# Patient Record
Sex: Female | Born: 1985 | Race: White | Hispanic: Yes | Marital: Single | Smoking: Current every day smoker
Health system: Southern US, Community
[De-identification: ages and names within clinical notes are randomized; demographics above are authoritative.]

## PROBLEM LIST (undated history)

## (undated) ENCOUNTER — Inpatient Hospital Stay (HOSPITAL_COMMUNITY): Payer: Self-pay

## (undated) DIAGNOSIS — F329 Major depressive disorder, single episode, unspecified: Secondary | ICD-10-CM

## (undated) DIAGNOSIS — R569 Unspecified convulsions: Secondary | ICD-10-CM

## (undated) DIAGNOSIS — N390 Urinary tract infection, site not specified: Secondary | ICD-10-CM

## (undated) DIAGNOSIS — F319 Bipolar disorder, unspecified: Secondary | ICD-10-CM

## (undated) DIAGNOSIS — M069 Rheumatoid arthritis, unspecified: Secondary | ICD-10-CM

## (undated) DIAGNOSIS — F32A Depression, unspecified: Secondary | ICD-10-CM

## (undated) DIAGNOSIS — F419 Anxiety disorder, unspecified: Secondary | ICD-10-CM

## (undated) HISTORY — PX: WISDOM TOOTH EXTRACTION: SHX21

---

## 2006-12-20 ENCOUNTER — Encounter: Payer: Self-pay | Admitting: Maternal & Fetal Medicine

## 2007-01-11 ENCOUNTER — Emergency Department: Payer: Self-pay | Admitting: Emergency Medicine

## 2007-01-26 ENCOUNTER — Emergency Department: Payer: Self-pay | Admitting: Emergency Medicine

## 2007-02-17 ENCOUNTER — Observation Stay: Payer: Self-pay | Admitting: Obstetrics & Gynecology

## 2007-07-04 ENCOUNTER — Observation Stay: Payer: Self-pay

## 2007-07-07 ENCOUNTER — Observation Stay: Payer: Self-pay

## 2007-07-08 ENCOUNTER — Inpatient Hospital Stay: Payer: Self-pay

## 2008-10-25 ENCOUNTER — Emergency Department: Payer: Self-pay | Admitting: Unknown Physician Specialty

## 2008-12-13 ENCOUNTER — Emergency Department: Payer: Self-pay | Admitting: Internal Medicine

## 2008-12-14 ENCOUNTER — Emergency Department: Payer: Self-pay | Admitting: Emergency Medicine

## 2009-03-18 ENCOUNTER — Observation Stay: Payer: Self-pay | Admitting: Obstetrics & Gynecology

## 2009-06-02 ENCOUNTER — Observation Stay: Payer: Self-pay

## 2009-06-17 ENCOUNTER — Inpatient Hospital Stay: Payer: Self-pay

## 2009-07-09 ENCOUNTER — Emergency Department: Payer: Self-pay | Admitting: Emergency Medicine

## 2010-12-02 ENCOUNTER — Emergency Department: Payer: Self-pay | Admitting: Emergency Medicine

## 2011-11-06 ENCOUNTER — Emergency Department: Payer: Self-pay | Admitting: Emergency Medicine

## 2011-11-06 LAB — PREGNANCY, URINE: Pregnancy Test, Urine: NEGATIVE m[IU]/mL

## 2011-12-18 ENCOUNTER — Emergency Department: Payer: Self-pay | Admitting: *Deleted

## 2011-12-18 LAB — URINALYSIS, COMPLETE
Leukocyte Esterase: NEGATIVE
Nitrite: NEGATIVE
Protein: NEGATIVE
RBC,UR: 2 /HPF (ref 0–5)
Specific Gravity: 1.021 (ref 1.003–1.030)
Squamous Epithelial: 6
WBC UR: 4 /HPF (ref 0–5)

## 2011-12-18 LAB — DRUG SCREEN, URINE
Barbiturates, Ur Screen: NEGATIVE (ref ?–200)
Cannabinoid 50 Ng, Ur ~~LOC~~: NEGATIVE (ref ?–50)
MDMA (Ecstasy)Ur Screen: NEGATIVE (ref ?–500)
Methadone, Ur Screen: POSITIVE (ref ?–300)
Opiate, Ur Screen: NEGATIVE (ref ?–300)
Phencyclidine (PCP) Ur S: NEGATIVE (ref ?–25)

## 2011-12-18 LAB — COMPREHENSIVE METABOLIC PANEL
Albumin: 3.5 g/dL (ref 3.4–5.0)
Anion Gap: 7 (ref 7–16)
BUN: 15 mg/dL (ref 7–18)
Chloride: 105 mmol/L (ref 98–107)
Co2: 28 mmol/L (ref 21–32)
Creatinine: 1.14 mg/dL (ref 0.60–1.30)
EGFR (African American): >60
EGFR (Non-African Amer.): >60
Glucose: 138 mg/dL — ABNORMAL HIGH (ref 65–99)
Osmolality: 282 (ref 275–301)
Potassium: 4.1 mmol/L (ref 3.5–5.1)
SGOT(AST): 41 U/L — ABNORMAL HIGH (ref 15–37)
Total Protein: 7.5 g/dL (ref 6.4–8.2)

## 2011-12-18 LAB — ETHANOL: Ethanol: <3 mg/dL

## 2011-12-18 LAB — CBC
MCH: 30 pg (ref 26.0–34.0)
MCHC: 32.6 g/dL (ref 32.0–36.0)
RBC: 4.72 10*6/uL (ref 3.80–5.20)
WBC: 7 10*3/uL (ref 3.6–11.0)

## 2011-12-18 LAB — ACETAMINOPHEN LEVEL: Acetaminophen: <2 ug/mL

## 2011-12-18 LAB — TSH: Thyroid Stimulating Horm: 0.4 u[IU]/mL — ABNORMAL LOW

## 2012-01-16 ENCOUNTER — Emergency Department: Payer: Self-pay | Admitting: Emergency Medicine

## 2012-01-16 LAB — COMPREHENSIVE METABOLIC PANEL
Anion Gap: 7 (ref 7–16)
BUN: 14 mg/dL (ref 7–18)
Bilirubin,Total: 0.8 mg/dL (ref 0.2–1.0)
Calcium, Total: 9.6 mg/dL (ref 8.5–10.1)
Chloride: 105 mmol/L (ref 98–107)
Co2: 27 mmol/L (ref 21–32)
Creatinine: 0.96 mg/dL (ref 0.60–1.30)
EGFR (African American): >60
EGFR (Non-African Amer.): >60
Glucose: 105 mg/dL — ABNORMAL HIGH (ref 65–99)
Osmolality: 278 (ref 275–301)
Potassium: 5.4 mmol/L — ABNORMAL HIGH (ref 3.5–5.1)
Sodium: 139 mmol/L (ref 136–145)
Total Protein: 8.7 g/dL — ABNORMAL HIGH (ref 6.4–8.2)

## 2012-01-16 LAB — CBC
HCT: 45.3 % (ref 35.0–47.0)
HGB: 14.9 g/dL (ref 12.0–16.0)
MCH: 30.1 pg (ref 26.0–34.0)
MCHC: 32.8 g/dL (ref 32.0–36.0)
MCV: 92 fL (ref 80–100)
Platelet: 374 10*3/uL (ref 150–440)

## 2012-01-16 LAB — DRUG SCREEN, URINE
Amphetamines, Ur Screen: NEGATIVE (ref ?–1000)
Benzodiazepine, Ur Scrn: POSITIVE (ref ?–200)
Cannabinoid 50 Ng, Ur ~~LOC~~: NEGATIVE (ref ?–50)
Cocaine Metabolite,Ur ~~LOC~~: POSITIVE (ref ?–300)
MDMA (Ecstasy)Ur Screen: NEGATIVE (ref ?–500)
Methadone, Ur Screen: NEGATIVE (ref ?–300)
Opiate, Ur Screen: NEGATIVE (ref ?–300)
Tricyclic, Ur Screen: NEGATIVE (ref ?–1000)

## 2012-01-16 LAB — URINALYSIS, COMPLETE
Bilirubin,UR: NEGATIVE
Glucose,UR: NEGATIVE mg/dL (ref 0–75)
Ketone: NEGATIVE
Leukocyte Esterase: NEGATIVE
Ph: 5 (ref 4.5–8.0)
Protein: NEGATIVE
RBC,UR: NONE SEEN /HPF (ref 0–5)
Specific Gravity: 1.027 (ref 1.003–1.030)
Squamous Epithelial: 6

## 2012-01-16 LAB — TSH: Thyroid Stimulating Horm: 0.765 u[IU]/mL

## 2012-01-16 LAB — PREGNANCY, URINE: Pregnancy Test, Urine: NEGATIVE m[IU]/mL

## 2012-01-16 LAB — ETHANOL: Ethanol: <3 mg/dL

## 2012-01-16 LAB — SALICYLATE LEVEL: Salicylates, Serum: 1.9 mg/dL

## 2012-03-10 ENCOUNTER — Emergency Department: Payer: Self-pay | Admitting: Emergency Medicine

## 2012-03-14 LAB — WOUND CULTURE

## 2012-06-16 ENCOUNTER — Emergency Department: Payer: Self-pay | Admitting: Emergency Medicine

## 2012-07-19 ENCOUNTER — Emergency Department: Payer: Self-pay | Admitting: Emergency Medicine

## 2012-11-13 ENCOUNTER — Emergency Department: Payer: Self-pay | Admitting: Emergency Medicine

## 2013-03-22 ENCOUNTER — Emergency Department: Payer: Self-pay | Admitting: Internal Medicine

## 2013-03-22 LAB — DRUG SCREEN, URINE
Amphetamines, Ur Screen: NEGATIVE (ref ?–1000)
Benzodiazepine, Ur Scrn: POSITIVE (ref ?–200)
Cocaine Metabolite,Ur ~~LOC~~: POSITIVE (ref ?–300)
Methadone, Ur Screen: NEGATIVE (ref ?–300)
Opiate, Ur Screen: POSITIVE (ref ?–300)
Phencyclidine (PCP) Ur S: NEGATIVE (ref ?–25)
Tricyclic, Ur Screen: NEGATIVE (ref ?–1000)

## 2013-03-22 LAB — URINALYSIS, COMPLETE
Glucose,UR: NEGATIVE mg/dL (ref 0–75)
Ketone: NEGATIVE
Nitrite: NEGATIVE
RBC,UR: 3 /HPF (ref 0–5)
Specific Gravity: 1.014 (ref 1.003–1.030)
WBC UR: 32 /HPF (ref 0–5)

## 2013-11-27 ENCOUNTER — Encounter (HOSPITAL_COMMUNITY): Payer: Self-pay | Admitting: Emergency Medicine

## 2013-11-27 ENCOUNTER — Emergency Department (HOSPITAL_COMMUNITY)
Admission: EM | Admit: 2013-11-27 | Discharge: 2013-11-27 | Disposition: A | Discharge status: Home / Self Care | Payer: Medicaid Other | Attending: Emergency Medicine | Admitting: Emergency Medicine

## 2013-11-27 DIAGNOSIS — S335XXA Sprain of ligaments of lumbar spine, initial encounter: Secondary | ICD-10-CM | POA: Insufficient documentation

## 2013-11-27 DIAGNOSIS — F17200 Nicotine dependence, unspecified, uncomplicated: Secondary | ICD-10-CM | POA: Insufficient documentation

## 2013-11-27 DIAGNOSIS — S139XXA Sprain of joints and ligaments of unspecified parts of neck, initial encounter: Secondary | ICD-10-CM | POA: Insufficient documentation

## 2013-11-27 DIAGNOSIS — Y929 Unspecified place or not applicable: Secondary | ICD-10-CM | POA: Diagnosis not present

## 2013-11-27 DIAGNOSIS — S161XXA Strain of muscle, fascia and tendon at neck level, initial encounter: Secondary | ICD-10-CM

## 2013-11-27 DIAGNOSIS — Y9389 Activity, other specified: Secondary | ICD-10-CM | POA: Diagnosis not present

## 2013-11-27 DIAGNOSIS — S199XXA Unspecified injury of neck, initial encounter: Secondary | ICD-10-CM | POA: Diagnosis present

## 2013-11-27 DIAGNOSIS — S39012A Strain of muscle, fascia and tendon of lower back, initial encounter: Secondary | ICD-10-CM

## 2013-11-27 DIAGNOSIS — S0993XA Unspecified injury of face, initial encounter: Secondary | ICD-10-CM | POA: Diagnosis present

## 2013-11-27 MED ORDER — CYCLOBENZAPRINE HCL 10 MG PO TABS: 10.0000 mg | ORAL_TABLET | Freq: Two times a day (BID) | ORAL | Status: DC | PRN

## 2013-11-27 MED ORDER — HYDROCODONE-ACETAMINOPHEN 5-325 MG PO TABS
2.0000 | ORAL_TABLET | Freq: Once | ORAL | Status: AC
  Administered 2013-11-27: 2 via ORAL
  Filled 2013-11-27: qty 2

## 2013-11-27 MED ORDER — HYDROCODONE-ACETAMINOPHEN 5-325 MG PO TABS
1.0000 | ORAL_TABLET | Freq: Four times a day (QID) | ORAL | Status: DC | PRN
Start: 2013-11-27 — End: 2014-01-03
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
## 2013-11-27 NOTE — ED Provider Notes (Signed)
Medical screening examination/treatment/procedure(s) were performed by non-physician practitioner and as supervising physician I was immediately available for consultation/collaboration.   EKG Interpretation None        William Layana Konkel, MD 11/27/13 2259 
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
## 2013-12-04 ENCOUNTER — Emergency Department: Payer: Self-pay | Admitting: Emergency Medicine

## 2013-12-04 LAB — CBC
HCT: 43.2 % (ref 35.0–47.0)
HGB: 13.8 g/dL (ref 12.0–16.0)
MCH: 30.1 pg (ref 26.0–34.0)
MCHC: 32 g/dL (ref 32.0–36.0)
MCV: 94 fL (ref 80–100)
Platelet: 373 10*3/uL (ref 150–440)
RBC: 4.58 10*6/uL (ref 3.80–5.20)
RDW: 14.3 % (ref 11.5–14.5)
WBC: 13.1 10*3/uL — AB (ref 3.6–11.0)

## 2013-12-04 LAB — HCG, QUANTITATIVE, PREGNANCY: BETA HCG, QUANT.: 16 m[IU]/mL — AB

## 2013-12-05 LAB — URINALYSIS, COMPLETE
Bilirubin,UR: NEGATIVE
Glucose,UR: NEGATIVE mg/dL (ref 0–75)
KETONE: NEGATIVE
Leukocyte Esterase: NEGATIVE
Nitrite: POSITIVE
PH: 6 (ref 4.5–8.0)
Protein: NEGATIVE
RBC,UR: 1 /HPF (ref 0–5)
Specific Gravity: 1.013 (ref 1.003–1.030)
Squamous Epithelial: 1
WBC UR: 18 /HPF (ref 0–5)

## 2013-12-05 LAB — GC/CHLAMYDIA PROBE AMP

## 2013-12-05 LAB — WET PREP, GENITAL

## 2014-01-03 ENCOUNTER — Encounter (HOSPITAL_COMMUNITY): Payer: Self-pay | Admitting: Emergency Medicine

## 2014-01-03 DIAGNOSIS — M5380 Other specified dorsopathies, site unspecified: Secondary | ICD-10-CM | POA: Diagnosis not present

## 2014-01-03 DIAGNOSIS — F17200 Nicotine dependence, unspecified, uncomplicated: Secondary | ICD-10-CM | POA: Insufficient documentation

## 2014-01-03 DIAGNOSIS — M545 Low back pain, unspecified: Secondary | ICD-10-CM | POA: Diagnosis not present

## 2014-01-03 DIAGNOSIS — Z79899 Other long term (current) drug therapy: Secondary | ICD-10-CM | POA: Insufficient documentation

## 2014-01-03 DIAGNOSIS — Z87440 Personal history of urinary (tract) infections: Secondary | ICD-10-CM | POA: Diagnosis not present

## 2014-01-03 DIAGNOSIS — R109 Unspecified abdominal pain: Secondary | ICD-10-CM | POA: Diagnosis present

## 2014-01-03 LAB — COMPREHENSIVE METABOLIC PANEL
ALT: 47 U/L — AB (ref 0–35)
AST: 31 U/L (ref 0–37)
Albumin: 4.1 g/dL (ref 3.5–5.2)
Alkaline Phosphatase: 69 U/L (ref 39–117)
BILIRUBIN TOTAL: 0.3 mg/dL (ref 0.3–1.2)
BUN: 9 mg/dL (ref 6–23)
CHLORIDE: 101 meq/L (ref 96–112)
CO2: 25 mEq/L (ref 19–32)
Calcium: 9.7 mg/dL (ref 8.4–10.5)
Creatinine, Ser: 0.73 mg/dL (ref 0.50–1.10)
GFR calc Af Amer: >90 mL/min (ref >90)
Glucose, Bld: 102 mg/dL — ABNORMAL HIGH (ref 70–99)
Potassium: 4.1 mEq/L (ref 3.7–5.3)
Sodium: 140 mEq/L (ref 137–147)
Total Protein: 7.9 g/dL (ref 6.0–8.3)

## 2014-01-03 LAB — URINALYSIS, ROUTINE W REFLEX MICROSCOPIC
Bilirubin Urine: NEGATIVE
Glucose, UA: NEGATIVE mg/dL
Hgb urine dipstick: NEGATIVE
Ketones, ur: NEGATIVE mg/dL
Leukocytes, UA: NEGATIVE
Nitrite: NEGATIVE
Protein, ur: NEGATIVE mg/dL
SPECIFIC GRAVITY, URINE: 1.013 (ref 1.005–1.030)
Urobilinogen, UA: 0.2 mg/dL (ref 0.0–1.0)
pH: 5.5 (ref 5.0–8.0)

## 2014-01-03 LAB — CBC WITH DIFFERENTIAL/PLATELET
Basophils Absolute: 0 10*3/uL (ref 0.0–0.1)
Basophils Relative: 0 % (ref 0–1)
Eosinophils Absolute: 0.2 10*3/uL (ref 0.0–0.7)
Eosinophils Relative: 2 % (ref 0–5)
HEMATOCRIT: 41.6 % (ref 36.0–46.0)
Hemoglobin: 13.5 g/dL (ref 12.0–15.0)
LYMPHS PCT: 32 % (ref 12–46)
Lymphs Abs: 3.9 10*3/uL (ref 0.7–4.0)
MCH: 29.9 pg (ref 26.0–34.0)
MCHC: 32.5 g/dL (ref 30.0–36.0)
MCV: 92 fL (ref 78.0–100.0)
MONO ABS: 0.6 10*3/uL (ref 0.1–1.0)
Monocytes Relative: 5 % (ref 3–12)
NEUTROS ABS: 7.6 10*3/uL (ref 1.7–7.7)
Neutrophils Relative %: 61 % (ref 43–77)
Platelets: 379 10*3/uL (ref 150–400)
RBC: 4.52 MIL/uL (ref 3.87–5.11)
RDW: 13.4 % (ref 11.5–15.5)
WBC: 12.3 10*3/uL — AB (ref 4.0–10.5)

## 2014-01-03 LAB — PREGNANCY, URINE: PREG TEST UR: NEGATIVE

## 2014-01-03 NOTE — ED Notes (Signed)
Pt. reports bilateral flank pain more at right side with hematuria onset 2 months ago , denies dysuria , pt. stated history of recurrent UTI 's.

## 2014-01-04 ENCOUNTER — Emergency Department (HOSPITAL_COMMUNITY)
Admission: EM | Admit: 2014-01-04 | Discharge: 2014-01-04 | Disposition: A | Discharge status: Home / Self Care | Payer: Medicaid Other | Attending: Emergency Medicine | Admitting: Emergency Medicine

## 2014-01-04 DIAGNOSIS — M545 Low back pain, unspecified: Secondary | ICD-10-CM

## 2014-01-04 DIAGNOSIS — M62830 Muscle spasm of back: Secondary | ICD-10-CM

## 2014-01-04 HISTORY — DX: Urinary tract infection, site not specified: N39.0

## 2014-01-04 MED ORDER — OXYCODONE-ACETAMINOPHEN 5-325 MG PO TABS
2.0000 | ORAL_TABLET | Freq: Once | ORAL | Status: AC
  Administered 2014-01-04: 2 via ORAL
  Filled 2014-01-04: qty 2

## 2014-01-04 MED ORDER — OXYCODONE-ACETAMINOPHEN 5-325 MG PO TABS: 1.0000 | ORAL_TABLET | Freq: Four times a day (QID) | ORAL | Status: AC | PRN

## 2014-01-04 MED ORDER — METHOCARBAMOL 500 MG PO TABS
1000.0000 mg | ORAL_TABLET | Freq: Once | ORAL | Status: AC
  Administered 2014-01-04: 1000 mg via ORAL
  Filled 2014-01-04: qty 2

## 2014-01-04 MED ORDER — METHOCARBAMOL 750 MG PO TABS: 750.0000 mg | ORAL_TABLET | Freq: Four times a day (QID) | ORAL | Status: AC

## 2014-01-04 NOTE — Discharge Instructions (Signed)
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
## 2014-03-19 ENCOUNTER — Encounter (HOSPITAL_COMMUNITY): Payer: Self-pay | Admitting: Emergency Medicine

## 2014-03-19 DIAGNOSIS — Z79899 Other long term (current) drug therapy: Secondary | ICD-10-CM | POA: Insufficient documentation

## 2014-03-19 DIAGNOSIS — O9989 Other specified diseases and conditions complicating pregnancy, childbirth and the puerperium: Secondary | ICD-10-CM | POA: Insufficient documentation

## 2014-03-19 DIAGNOSIS — M545 Low back pain, unspecified: Secondary | ICD-10-CM | POA: Insufficient documentation

## 2014-03-19 DIAGNOSIS — O99330 Smoking (tobacco) complicating pregnancy, unspecified trimester: Secondary | ICD-10-CM | POA: Diagnosis not present

## 2014-03-19 DIAGNOSIS — Z87440 Personal history of urinary (tract) infections: Secondary | ICD-10-CM | POA: Insufficient documentation

## 2014-03-19 DIAGNOSIS — J029 Acute pharyngitis, unspecified: Secondary | ICD-10-CM | POA: Insufficient documentation

## 2014-03-19 LAB — RAPID STREP SCREEN (MED CTR MEBANE ONLY): Streptococcus, Group A Screen (Direct): NEGATIVE

## 2014-03-19 NOTE — ED Notes (Signed)
Pt. reports sore throat with left tonsil swelling onset 2 days ago and mid/low back pain for several days , denies injury , ambulatory , no urinary discomfort , pt. stated she is 3 months pregnant (G3P2).

## 2014-03-20 ENCOUNTER — Emergency Department (HOSPITAL_COMMUNITY)
Admission: EM | Admit: 2014-03-20 | Discharge: 2014-03-20 | Discharge status: Against Medical Advice | Payer: Medicaid Other | Attending: Emergency Medicine | Admitting: Emergency Medicine

## 2014-03-20 DIAGNOSIS — M545 Low back pain, unspecified: Secondary | ICD-10-CM

## 2014-03-20 DIAGNOSIS — J029 Acute pharyngitis, unspecified: Secondary | ICD-10-CM

## 2014-03-20 LAB — URINALYSIS, ROUTINE W REFLEX MICROSCOPIC
Bilirubin Urine: NEGATIVE
GLUCOSE, UA: NEGATIVE mg/dL
Hgb urine dipstick: NEGATIVE
Ketones, ur: NEGATIVE mg/dL
LEUKOCYTES UA: NEGATIVE
NITRITE: NEGATIVE
PH: 5.5 (ref 5.0–8.0)
Protein, ur: NEGATIVE mg/dL
Specific Gravity, Urine: 1.011 (ref 1.005–1.030)
Urobilinogen, UA: 0.2 mg/dL (ref 0.0–1.0)

## 2014-03-20 LAB — PREGNANCY, URINE: PREG TEST UR: POSITIVE — AB

## 2014-03-20 NOTE — ED Notes (Signed)
Assisted patient to the restroom for urine specimen

## 2014-03-20 NOTE — ED Notes (Signed)
Secretary notified me that patient and significant other just left the room. Patient had not been seen by EDP.
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
## 2014-03-21 ENCOUNTER — Emergency Department: Payer: Self-pay | Admitting: Emergency Medicine

## 2014-03-21 LAB — CULTURE, GROUP A STREP

## 2014-03-22 LAB — URINALYSIS, COMPLETE
BILIRUBIN, UR: NEGATIVE
BLOOD: NEGATIVE
Bacteria: NONE SEEN
GLUCOSE, UR: NEGATIVE mg/dL (ref 0–75)
KETONE: NEGATIVE
Leukocyte Esterase: NEGATIVE
Nitrite: NEGATIVE
PROTEIN: NEGATIVE
Ph: 6 (ref 4.5–8.0)
RBC, UR: NONE SEEN /HPF (ref 0–5)
Specific Gravity: 1.016 (ref 1.003–1.030)
Squamous Epithelial: 2
WBC UR: 1 /HPF (ref 0–5)

## 2014-03-22 LAB — HCG, QUANTITATIVE, PREGNANCY: BETA HCG, QUANT.: 76692 m[IU]/mL — AB

## 2014-03-25 LAB — BETA STREP CULTURE(ARMC)

## 2014-04-09 ENCOUNTER — Emergency Department: Payer: Self-pay | Admitting: Emergency Medicine

## 2014-05-03 ENCOUNTER — Emergency Department: Payer: Self-pay | Admitting: Emergency Medicine

## 2014-05-03 LAB — URINALYSIS, COMPLETE
Bacteria: NONE SEEN
Bilirubin,UR: NEGATIVE
Blood: NEGATIVE
GLUCOSE, UR: NEGATIVE mg/dL (ref 0–75)
Ketone: NEGATIVE
Leukocyte Esterase: NEGATIVE
Nitrite: NEGATIVE
PH: 5 (ref 4.5–8.0)
Protein: NEGATIVE
RBC,UR: <1 /HPF (ref 0–5)
Specific Gravity: 1.024 (ref 1.003–1.030)
Squamous Epithelial: 4

## 2014-05-03 LAB — COMPREHENSIVE METABOLIC PANEL
Albumin: 3.1 g/dL — ABNORMAL LOW (ref 3.4–5.0)
Alkaline Phosphatase: 54 U/L
Anion Gap: 9 (ref 7–16)
BUN: 9 mg/dL (ref 7–18)
Bilirubin,Total: 0.2 mg/dL (ref 0.2–1.0)
CALCIUM: 9.2 mg/dL (ref 8.5–10.1)
CO2: 26 mmol/L (ref 21–32)
Chloride: 103 mmol/L (ref 98–107)
Creatinine: 0.59 mg/dL — ABNORMAL LOW (ref 0.60–1.30)
EGFR (African American): >60
EGFR (Non-African Amer.): >60
Glucose: 96 mg/dL (ref 65–99)
Osmolality: 274 (ref 275–301)
Potassium: 4.1 mmol/L (ref 3.5–5.1)
SGOT(AST): 31 U/L (ref 15–37)
SGPT (ALT): 63 U/L
Sodium: 138 mmol/L (ref 136–145)
Total Protein: 7.4 g/dL (ref 6.4–8.2)

## 2014-05-03 LAB — CBC WITH DIFFERENTIAL/PLATELET
BASOS PCT: 0.4 %
Basophil #: 0.1 10*3/uL (ref 0.0–0.1)
EOS ABS: 0.2 10*3/uL (ref 0.0–0.7)
Eosinophil %: 1.4 %
HCT: 40.5 % (ref 35.0–47.0)
HGB: 13 g/dL (ref 12.0–16.0)
LYMPHS PCT: 17.1 %
Lymphocyte #: 2.8 10*3/uL (ref 1.0–3.6)
MCH: 29.8 pg (ref 26.0–34.0)
MCHC: 32.2 g/dL (ref 32.0–36.0)
MCV: 93 fL (ref 80–100)
MONOS PCT: 7 %
Monocyte #: 1.1 x10 3/mm — ABNORMAL HIGH (ref 0.2–0.9)
NEUTROS ABS: 12 10*3/uL — AB (ref 1.4–6.5)
Neutrophil %: 74.1 %
PLATELETS: 342 10*3/uL (ref 150–440)
RBC: 4.37 10*6/uL (ref 3.80–5.20)
RDW: 13 % (ref 11.5–14.5)
WBC: 16.1 10*3/uL — ABNORMAL HIGH (ref 3.6–11.0)

## 2014-05-03 LAB — HCG, QUANTITATIVE, PREGNANCY: BETA HCG, QUANT.: 34178 m[IU]/mL — AB

## 2014-05-16 ENCOUNTER — Ambulatory Visit: Payer: Self-pay | Admitting: Family Medicine

## 2014-05-22 ENCOUNTER — Ambulatory Visit: Payer: Self-pay | Admitting: Family Medicine

## 2014-05-31 ENCOUNTER — Encounter: Payer: Self-pay | Admitting: Obstetrics & Gynecology

## 2014-06-08 ENCOUNTER — Observation Stay: Payer: Self-pay

## 2014-06-08 LAB — URINALYSIS, COMPLETE
BACTERIA: NONE SEEN
Bilirubin,UR: NEGATIVE
Blood: NEGATIVE
GLUCOSE, UR: NEGATIVE mg/dL (ref 0–75)
Ketone: NEGATIVE
Leukocyte Esterase: NEGATIVE
NITRITE: NEGATIVE
PROTEIN: NEGATIVE
Ph: 5 (ref 4.5–8.0)
RBC,UR: 2 /HPF (ref 0–5)
SPECIFIC GRAVITY: 1.032 (ref 1.003–1.030)
WBC UR: 1 /HPF (ref 0–5)

## 2014-08-03 ENCOUNTER — Observation Stay: Payer: Self-pay

## 2014-08-03 LAB — URINALYSIS, COMPLETE
Blood: NEGATIVE
GLUCOSE, UR: NEGATIVE mg/dL (ref 0–75)
Nitrite: NEGATIVE
Ph: 5 (ref 4.5–8.0)
Protein: 100
SPECIFIC GRAVITY: 1.035 (ref 1.003–1.030)

## 2014-08-03 LAB — DRUG SCREEN, URINE
Amphetamines, Ur Screen: NEGATIVE (ref ?–1000)
BARBITURATES, UR SCREEN: NEGATIVE (ref ?–200)
Benzodiazepine, Ur Scrn: NEGATIVE (ref ?–200)
Cannabinoid 50 Ng, Ur ~~LOC~~: NEGATIVE (ref ?–50)
Cocaine Metabolite,Ur ~~LOC~~: NEGATIVE (ref ?–300)
MDMA (Ecstasy)Ur Screen: NEGATIVE (ref ?–500)
Methadone, Ur Screen: NEGATIVE (ref ?–300)
OPIATE, UR SCREEN: POSITIVE (ref ?–300)
Phencyclidine (PCP) Ur S: NEGATIVE (ref ?–25)
TRICYCLIC, UR SCREEN: NEGATIVE (ref ?–1000)

## 2014-08-04 ENCOUNTER — Emergency Department: Payer: Self-pay | Admitting: Emergency Medicine

## 2014-09-08 ENCOUNTER — Inpatient Hospital Stay (HOSPITAL_COMMUNITY)
Admission: AD | Admit: 2014-09-08 | Discharge: 2014-09-08 | Disposition: A | Discharge status: Home / Self Care | Payer: Medicaid Other | Source: Ambulatory Visit | Attending: Obstetrics & Gynecology | Admitting: Obstetrics & Gynecology

## 2014-09-08 ENCOUNTER — Encounter (HOSPITAL_COMMUNITY): Payer: Self-pay | Admitting: *Deleted

## 2014-09-08 DIAGNOSIS — Z87891 Personal history of nicotine dependence: Secondary | ICD-10-CM | POA: Diagnosis not present

## 2014-09-08 DIAGNOSIS — Z3A36 36 weeks gestation of pregnancy: Secondary | ICD-10-CM | POA: Insufficient documentation

## 2014-09-08 DIAGNOSIS — O368130 Decreased fetal movements, third trimester, not applicable or unspecified: Secondary | ICD-10-CM | POA: Insufficient documentation

## 2014-09-08 DIAGNOSIS — O368131 Decreased fetal movements, third trimester, fetus 1: Secondary | ICD-10-CM

## 2014-09-08 DIAGNOSIS — Z3A37 37 weeks gestation of pregnancy: Secondary | ICD-10-CM

## 2014-09-08 NOTE — Discharge Instructions (Signed)
Fetal Movement Counts Performing a fetal movement count is highly recommended in high-risk pregnancies, but it is good for every pregnant woman to do. Your health care provider may ask you to start counting fetal movements at 28 weeks of the pregnancy. Fetal movements often increase:  After eating a full meal.  After physical activity.  After eating or drinking something sweet or cold.  At rest. Pay attention to when you feel the baby is most active. This will help you notice a pattern of your baby's sleep and wake cycles and what factors contribute to an increase in fetal movement. It is important to perform a fetal movement count at the same time each day when your baby is normally most active.  HOW TO COUNT FETAL MOVEMENTS 1. Find a quiet and comfortable area to sit or lie down on your left side. Lying on your left side provides the best blood and oxygen circulation to your baby. 2. Write down the day and time on a sheet of paper or in a journal. 3. Start counting kicks, flutters, swishes, rolls, or jabs in a 2-hour period. You should feel at least 10 movements within 2 hours. 4. If you do not feel 10 movements in 2 hours, wait 2-3 hours and count again. Look for a change in the pattern or not enough counts in 2 hours. SEEK MEDICAL CARE IF:  You feel less than 10 counts in 2 hours, tried twice.  There is no movement in over an hour.  The pattern is changing or taking longer each day to reach 10 counts in 2 hours.  You feel the baby is not moving as he or she usually does. Date: ____________ Movements: ____________ Start time: ____________ Finish time: ____________  Date: ____________ Movements: ____________ Start time: ____________ Finish time: ____________ Date: ____________ Movements: ____________ Start time: ____________ Finish time: ____________ Date: ____________ Movements: ____________ Start time: ____________ Finish time: ____________ Date: ____________ Movements: ____________  Start time: ____________ Finish time: ____________ Date: ____________ Movements: ____________ Start time: ____________ Finish time: ____________ Date: ____________ Movements: ____________ Start time: ____________ Finish time: ____________ Date: ____________ Movements: ____________ Start time: ____________ Finish time: ____________  Date: ____________ Movements: ____________ Start time: ____________ Finish time: ____________ Date: ____________ Movements: ____________ Start time: ____________ Finish time: ____________ Date: ____________ Movements: ____________ Start time: ____________ Finish time: ____________ Date: ____________ Movements: ____________ Start time: ____________ Finish time: ____________ Date: ____________ Movements: ____________ Start time: ____________ Finish time: ____________ Date: ____________ Movements: ____________ Start time: ____________ Finish time: ____________ Date: ____________ Movements: ____________ Start time: ____________ Finish time: ____________  Date: ____________ Movements: ____________ Start time: ____________ Finish time: ____________ Date: ____________ Movements: ____________ Start time: ____________ Finish time: ____________ Date: ____________ Movements: ____________ Start time: ____________ Finish time: ____________ Date: ____________ Movements: ____________ Start time: ____________ Finish time: ____________ Date: ____________ Movements: ____________ Start time: ____________ Finish time: ____________ Date: ____________ Movements: ____________ Start time: ____________ Finish time: ____________ Date: ____________ Movements: ____________ Start time: ____________ Finish time: ____________  Date: ____________ Movements: ____________ Start time: ____________ Finish time: ____________ Date: ____________ Movements: ____________ Start time: ____________ Finish time: ____________ Date: ____________ Movements: ____________ Start time: ____________ Finish time:  ____________ Date: ____________ Movements: ____________ Start time: ____________ Finish time: ____________ Date: ____________ Movements: ____________ Start time: ____________ Finish time: ____________ Date: ____________ Movements: ____________ Start time: ____________ Finish time: ____________ Date: ____________ Movements: ____________ Start time: ____________ Finish time: ____________  Date: ____________ Movements: ____________ Start time: ____________ Finish time: ____________ Date: ____________ Movements: ____________ Start   time: ____________ Sandra MartinFinish time: ____________ Date: ____________ Movements: ____________ Start time: ____________ Sandra MartinFinish time: ____________ Date: ____________ Movements: ____________ Start time: ____________ Sandra MartinFinish time: ____________ Date: ____________ Movements: ____________ Start time: ____________ Sandra MartinFinish time: ____________ Date: ____________ Movements: ____________ Start time: ____________ Sandra MartinFinish time: ____________ Date: ____________ Movements: ____________ Start time: ____________ Sandra MartinFinish time: ____________  Date: ____________ Movements: ____________ Start time: ____________ Sandra MartinFinish time: ____________ Date: ____________ Movements: ____________ Start time: ____________ Sandra MartinFinish time: ____________ Date: ____________ Movements: ____________ Start time: ____________ Sandra MartinFinish time: ____________ Date: ____________ Movements: ____________ Start time: ____________ Sandra MartinFinish time: ____________ Date: ____________ Movements: ____________ Start time: ____________ Sandra MartinFinish time: ____________ Date: ____________ Movements: ____________ Start time: ____________ Sandra MartinFinish time: ____________ Date: ____________ Movements: ____________ Start time: ____________ Sandra MartinFinish time: ____________  Date: ____________ Movements: ____________ Start time: ____________ Sandra MartinFinish time: ____________ Date: ____________ Movements: ____________ Start time: ____________ Sandra MartinFinish time: ____________ Date: ____________ Movements:  ____________ Start time: ____________ Sandra MartinFinish time: ____________ Date: ____________ Movements: ____________ Start time: ____________ Sandra MartinFinish time: ____________ Date: ____________ Movements: ____________ Start time: ____________ Sandra MartinFinish time: ____________ Date: ____________ Movements: ____________ Start time: ____________ Sandra MartinFinish time: ____________ Date: ____________ Movements: ____________ Start time: ____________ Sandra MartinFinish time: ____________  Date: ____________ Movements: ____________ Start time: ____________ Sandra MartinFinish time: ____________ Date: ____________ Movements: ____________ Start time: ____________ Sandra MartinFinish time: ____________ Date: ____________ Movements: ____________ Start time: ____________ Sandra MartinFinish time: ____________ Date: ____________ Movements: ____________ Start time: ____________ Sandra MartinFinish time: ____________ Date: ____________ Movements: ____________ Start time: ____________ Sandra MartinFinish time: ____________ Date: ____________ Movements: ____________ Start time: ____________ Sandra MartinFinish time: ____________ Document Released: 07/29/2006 Document Revised: 11/13/2013 Document Reviewed: 04/25/2012 Exit[employer] Patient Information 2015 [employer]. This information is not intended to replace advice given to you by your health care provider. Make sure you discuss any questions you have with your health care provider.

## 2014-09-08 NOTE — MAU Provider Note (Signed)
History     CSN: [id number]  Arrival date and time: 09/08/14 2123   First Provider Initiated Contact with Patient 09/08/14 2206      Chief Complaint  Patient presents with  . Decreased Fetal Movement   HPI Pt is a 29 y.o. [id number] at [id number] who presents for decreased FM over the past few weeks but more pronounced today. Reports still feeling him move but movements are smaller. Says that as soon as she got here he started moving normally again. She denies bleeding, LOF. Reports mild braxton-hicks contractions off and on for the past few weeks but none today. Otherwise feeling well.  [facility] but has gotten her prenatal care in [place]. Worried no one will be able to see her quickly enough to establish before delivery. Reports her pregnancy has been uncomplicated.  OB History    Gravida Para Term Preterm AB TAB SAB Ectopic Multiple Living   4 2 2  0 1 0 1 0 0 2      Past Medical History  Diagnosis Date  . UTI (lower urinary tract infection)     Past Surgical History  Procedure Laterality Date  . Wisdom tooth extraction      History reviewed. No pertinent family history.  History  Substance Use Topics  . Smoking status: Former Games developermoker  . Smokeless tobacco: Not on file  . Alcohol Use: No    Allergies: No Known Allergies  Prescriptions prior to admission  Medication Sig Dispense Refill Last Dose  . Prenatal Vit-Fe Fumarate-FA (PRENATAL MULTIVITAMIN) TABS tablet Take 1 tablet by mouth daily at 12 noon.   09/08/2014 at Unknown time  . methocarbamol (ROBAXIN-750) 750 MG tablet Take 1 tablet (750 mg total) by mouth 4 (four) times daily. 40 tablet 0   . oxyCODONE-acetaminophen (PERCOCET/ROXICET) 5-325 MG per tablet Take 1-2 tablets by mouth every 6 (six) hours as needed for severe pain. 30 tablet 0     ROS  See HPI  Physical Exam   Blood pressure 107/63, pulse 83, temperature 98.4 F (36.9 C), temperature source Oral, resp. rate 16, height 5\' 2"   (1.575 m), weight 79.289 kg (174 lb 12.8 oz), last menstrual period 12/31/2013.  Physical Exam  Nursing note and vitals reviewed. Constitutional: She is oriented to person, place, and time. She appears well-developed and well-nourished. No distress.  HENT:  Head: Normocephalic and atraumatic.  Eyes: Conjunctivae are normal. Right eye exhibits no discharge. Left eye exhibits no discharge. No scleral icterus.  Cardiovascular: Normal rate.   Respiratory: Effort normal. No respiratory distress.  GI: Soft. She exhibits no distension. There is no tenderness.  Neurological: She is alert and oriented to person, place, and time.  Skin: Skin is warm and dry. She is not diaphoretic.  Psychiatric: She has a normal mood and affect. Her behavior is normal.    MAU Course  Procedures  FHT: bl 125/mod var/+accels/-decels Toco: no contractions   Assessment and Plan  Decreased fetal movement likely related to GA/fetal size. Reactive NST and movement returned to normal during assessment. No other complaints. Discharge home with kick count instructions and labor precautions.  Advised patient to call to schedule prenatal visit on Monday to establish care prior to delivery. Message sent to women's clinic admin pool to call her as well.  Beverely Lowdamo, Elena 09/08/2014, 10:19 PM   I spoke with and examined patient and agree with resident/PA/SNM's note and plan of care.  36.6wk SIUP, reactive NST/Cat I Cheral MarkerKimberly R. Synia Douglass, CNM, WHNP-BC  09/09/2014 6:27 AM

## 2014-09-08 NOTE — MAU Note (Signed)
Patient state baby not moving as much as usual today.  Denies LOF or vaginal bleeding.

## 2014-09-10 ENCOUNTER — Encounter: Payer: Self-pay | Admitting: Family Medicine

## 2014-09-20 ENCOUNTER — Encounter: Payer: Medicaid Other | Admitting: Family Medicine

## 2014-09-20 ENCOUNTER — Telehealth: Payer: Self-pay | Admitting: *Deleted

## 2014-09-20 NOTE — Telephone Encounter (Signed)
Sandra JunglingMarcia missed her scheduled appointment for today to establish care in our clinic for delivery.  Called her and she states she thought it was tomorrow. She does want it to be rescheduled for asap so she can deliver here. I advised her to get her ob records sent to us to be available for her appointment or delivery - which ever happens first. Will have registar reschedule and call her with appointmetn.

## 2014-10-04 ENCOUNTER — Encounter: Payer: Medicaid Other | Admitting: Family Medicine

## 2014-10-11 LAB — COMPREHENSIVE METABOLIC PANEL
ANION GAP: 13 (ref 7–16)
Albumin: 4 g/dL
Alkaline Phosphatase: 62 U/L
BILIRUBIN TOTAL: 0.9 mg/dL
BUN: 12 mg/dL
CHLORIDE: 104 mmol/L
CO2: 22 mmol/L
Calcium, Total: 9.4 mg/dL
Creatinine: 0.66 mg/dL
GLUCOSE: 80 mg/dL
POTASSIUM: 3.7 mmol/L
SGOT(AST): 36 U/L
SGPT (ALT): 36 U/L
SODIUM: 139 mmol/L
Total Protein: 7.5 g/dL

## 2014-10-11 LAB — CBC
HCT: 40.8 % (ref 35.0–47.0)
HGB: 13 g/dL (ref 12.0–16.0)
MCH: 28.6 pg (ref 26.0–34.0)
MCHC: 31.9 g/dL — ABNORMAL LOW (ref 32.0–36.0)
MCV: 90 fL (ref 80–100)
PLATELETS: 533 10*3/uL — AB (ref 150–440)
RBC: 4.55 10*6/uL (ref 3.80–5.20)
RDW: 17.1 % — ABNORMAL HIGH (ref 11.5–14.5)
WBC: 10.8 10*3/uL (ref 3.6–11.0)

## 2014-10-11 LAB — LIPASE, BLOOD: Lipase: 42 U/L

## 2014-10-12 ENCOUNTER — Observation Stay
Admit: 2014-10-12 | Disposition: A | Discharge status: Home / Self Care | Payer: Self-pay | Attending: Internal Medicine | Admitting: Internal Medicine

## 2014-10-12 LAB — DRUG SCREEN, URINE
Amphetamines, Ur Screen: NEGATIVE
BARBITURATES, UR SCREEN: NEGATIVE
Benzodiazepine, Ur Scrn: NEGATIVE
Cannabinoid 50 Ng, Ur ~~LOC~~: NEGATIVE
Cocaine Metabolite,Ur ~~LOC~~: NEGATIVE
MDMA (Ecstasy)Ur Screen: NEGATIVE
METHADONE, UR SCREEN: NEGATIVE
Opiate, Ur Screen: POSITIVE
Phencyclidine (PCP) Ur S: NEGATIVE
TRICYCLIC, UR SCREEN: NEGATIVE

## 2014-10-12 LAB — URINALYSIS, COMPLETE
BACTERIA: NONE SEEN
BILIRUBIN, UR: NEGATIVE
BLOOD: NEGATIVE
Glucose,UR: NEGATIVE mg/dL (ref 0–75)
Leukocyte Esterase: NEGATIVE
NITRITE: NEGATIVE
Ph: 5 (ref 4.5–8.0)
Protein: NEGATIVE
RBC,UR: 2 /HPF (ref 0–5)
SPECIFIC GRAVITY: 1.024 (ref 1.003–1.030)

## 2014-10-12 LAB — BASIC METABOLIC PANEL
ANION GAP: 6 — AB (ref 7–16)
BUN: 15 mg/dL
CREATININE: 0.71 mg/dL
Calcium, Total: 8.4 mg/dL — ABNORMAL LOW
Chloride: 109 mmol/L
Co2: 25 mmol/L
GLUCOSE: 86 mg/dL
Potassium: 3.4 mmol/L — ABNORMAL LOW
Sodium: 140 mmol/L

## 2014-10-12 LAB — CBC WITH DIFFERENTIAL/PLATELET
Basophil #: 0.1 10*3/uL (ref 0.0–0.1)
Basophil %: 1.2 %
EOS PCT: 3.9 %
Eosinophil #: 0.3 10*3/uL (ref 0.0–0.7)
HCT: 37 % (ref 35.0–47.0)
HGB: 12.2 g/dL (ref 12.0–16.0)
LYMPHS ABS: 3.4 10*3/uL (ref 1.0–3.6)
Lymphocyte %: 42 %
MCH: 29.4 pg (ref 26.0–34.0)
MCHC: 32.9 g/dL (ref 32.0–36.0)
MCV: 89 fL (ref 80–100)
MONO ABS: 0.8 x10 3/mm (ref 0.2–0.9)
Monocyte %: 9.3 %
Neutrophil #: 3.5 10*3/uL (ref 1.4–6.5)
Neutrophil %: 43.6 %
PLATELETS: 451 10*3/uL — AB (ref 150–440)
RBC: 4.14 10*6/uL (ref 3.80–5.20)
RDW: 17.3 % — ABNORMAL HIGH (ref 11.5–14.5)
WBC: 8.1 10*3/uL (ref 3.6–11.0)

## 2014-10-12 LAB — PROTIME-INR
INR: 1.1
Prothrombin Time: 14.3 secs

## 2014-11-11 NOTE — Discharge Summary (Signed)
PATIENT NAME:  Sandra Gould, Sandra C MR#:  [id number] DATE OF BIRTH:  Jul 22, 1985  DATE OF ADMISSION:  10/12/2014 DATE OF DISCHARGE:  10/12/2014  ADMITTING PHYSICIAN: Oralia Manisavid Willis, MD  DISCHARGING PHYSICIAN: Enid Baasadhika Timathy Newberry, MD  CONSULTATIONS IN HOSPITAL: Dr. Forestine Chutearrie Polin from anesthesiology.  DISCHARGE DIAGNOSES: 1.  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  2.  Hypokalemia.   DISCHARGE HOME MEDICATIONS: 1.  Prenatal vitamins 1 tablet p.o. daily.  2.  Percocet 5/325 mg 1 tablet q. 6 hours p.r.n. for pain.   DISCHARGE DIET: Regular diet.   DISCHARGE ACTIVITY: As tolerated.   FOLLOWUP INSTRUCTIONS: OB/GYN follow-up as per scheduled.  DIAGNOSTIC DATA: Prior to discharge: WBC 8.1, hemoglobin 12.2, hematocrit 37, platelet count 451,000.   Sodium 140, potassium 3.4, chloride 109, bicarb 25, BUN 15, creatinine 0.71, glucose 86, and calcium 8.4. Urinalysis negative for any infection. Urine toxicology screen positive for opiates. The patient on Norco at home for her pain.   MR venogram of the brain showing major dural sinuses appear normal. No occlusion or filling defect seen. Overall negative scan.   BRIEF HOSPITAL COURSE: Sandra Gould is a very pleasant 29 year old Caucasian female who had a delivery on September 24, 2014. She did have an epidural injection done and [facility]. The patient states she never had significant headaches in the past, but had headache which was more positional, worse on sitting and standing and improves on lying down. So this was considered postdural puncture headache.   Postdural puncture headache from epidural placement at outside hospital: Since it was atypical presentation 2 weeks after the procedure, MRV was done to make sure there are no cerebral venous thrombosis which was negative so the patient is having an epidural blood patch placed by anesthesia here and will be discharged on Percocet for pain as  needed.   Her course has been otherwise uneventful in the hospital.   DISCHARGE CONDITION: Stable.   DISCHARGE DISPOSITION: Home.   TIME SPENT ON DISCHARGE: 40 minutes.   ____________________________ Enid Baasadhika Presleigh Feldstein, MD rk:sb D: 10/12/2014 15:16:40 ET T: 10/12/2014 15:28:17 ET JOB#: [id number]  cc: Enid Baasadhika Milinda Sweeney, MD, <Dictator> Enid BaasADHIKA Kandis Henry MD ELECTRONICALLY SIGNED 10/18/2014 12:27

## 2014-11-11 NOTE — Consult Note (Signed)
Anesthesiology Consult Note to see patient for possible PDPH.  Pt states she developed a headache shortly following her delivery on 3/14.  Pt had an epidural placed for her delivery [facility].  Pt states her headache during her initial admission resembled her migraines and were relieved with pain medication.  She was discharged home and had recurrence of her headaches over the past two weeks, increasing in intensity over this time.  She describes the headaches as throbbing, located around her forehead and extending to the top of her head.  She states the headaches will increase in intensity to 10/10 approx 30-60 min after standing or sitting and will decrease in intensity to 6/10 approx 30-7560minutes after laying down.  She notes accompanied nausea with vomiting.  She denies photophobia, numbness, weakness, visual changes, or hearing changes.  She denies any relief of her headaches with pain medications.   CNII-X grossly intact, strength 5/5 in all extremities Pt with possible PDPH from prior epidural placement at OSH.  Because of atypical presentation, would recommend an MRV to r/o cerebral venous thrombosis prior to epidural blood patch.  Pt explained risks/benefits of epidural blood patch and will proceed with procedure if MRV shows no abnormalities.    Electronic Signatures: Delsa GranaPolin, Nekeisha Aure M (MD)  (Signed on 01-Apr-16 12:34)  Authored  Last Updated: 01-Apr-16 12:34 by Delsa GranaPolin, Tanya Marvin M (MD)

## 2014-11-11 NOTE — H&P (Signed)
PATIENT NAME:  Sandra Gould, Sandra Gould MR#:  [id number] DATE OF BIRTH:  1986-05-04  DATE OF ADMISSION:  10/12/2014  CHIEF COMPLAINT:  Headache.   HISTORY OF PRESENT ILLNESS:  This is a 29 year old female who presents to the ED today with complaint of ongoing positional-related headaches. She states that this headache started after she received an epidural during delivery of her baby, which occurred on 09/24/2014. Since that time at home, she has had this headache that every time she sits up or stands up gets worse and is only relieved with lying down for a certain amount of time. Anesthesia was consulted in the ED and requested admission under the hospitalist service in order to perform a blood patch for her in the morning. The hospitalists were consulted for admission for the same.   PRIMARY CARE PHYSICIAN:  Nonlocal.   PAST MEDICAL HISTORY:  Hepatitis Gould and past drug abuse.   CURRENT MEDICATIONS:  Include only prenatal vitamins.   PAST SURGICAL HISTORY:  The patient denies any prior surgical history.   ALLERGIES:  No known drug allergies.   FAMILY HISTORY:  The patient denies any significant family history.   SOCIAL HISTORY:  The patient is a nonsmoker and nondrinker. Endorses past heroin use. She states that she has not used any heroin for the past several months.   REVIEW OF SYSTEMS: CONSTITUTIONAL:  Denies fever, fatigue, or weakness.  EYES:  Denies blurred or double vision, pain, or redness.  EARS, NOSE, AND THROAT:  Denies ear pain, hearing loss, or difficulty swallowing.  RESPIRATORY:  Denies cough, wheeze, or dyspnea.  CARDIOVASCULAR:  Denies chest pain, edema, or palpitations.  GASTROINTESTINAL:  Denies nausea, vomiting, diarrhea, abdominal pain, or constipation.  GENITOURINARY:  Denies dysuria, hematuria, or frequency.  ENDOCRINE:  Denies nocturia, thyroid problems, or heat or cold intolerance.  HEMATOLOGIC AND LYMPHATIC:  Denies easy bruising or bleeding or swollen glands.   INTEGUMENTARY:  Denies acne, rash, or lesion.  MUSCULOSKELETAL:  Denies acute arthritis, joint swelling, or gout.  NEUROLOGICAL:  Denies numbness or weakness. Endorses positional headache, as per HPI.  PSYCHIATRIC:  Denies anxiety, insomnia, or depression.   PHYSICAL EXAMINATION: VITAL SIGNS:  Blood pressure 108/70, pulse 72, temperature 99, respirations 18, oxygen saturation is 97% on room air.  GENERAL:  This is a well-nourished female lying in bed in no apparent distress.  HEENT:  Pupils are equal, round, and reactive to light and accommodation. Extraocular movements are intact. No scleral icterus. Moist mucosal membranes.  NECK:  Thyroid is not enlarged. Neck is supple and nontender. No cervical adenopathy. No JVD.  RESPIRATORY:  Clear to auscultation bilaterally with no rales, rhonchi, or wheezes. No respiratory distress.  CARDIOVASCULAR:  Regular rate and rhythm. No murmurs, rubs, or gallops on exam. Good pedal pulses. No lower extremity edema.  ABDOMEN:  Soft, nontender, nondistended. Good bowel sounds.  MUSCULOSKELETAL:  Muscular strength is 5/5 in all 4 extremities with full spontaneous range of motion throughout. No cyanosis or clubbing.  SKIN:  No rash or lesions. Skin is warm and dry.  LYMPHATIC:  No lymphadenopathy.  NEUROLOGIC:  Cranial nerves are intact. Sensation is intact throughout. No dysarthria or aphasia.  PSYCHIATRIC:  Alert and oriented x 3, cooperative, not confused.   LABORATORY DATA:  White count is 7.8, hemoglobin 13, hematocrit 40, and platelets are 533,000. Sodium is 139, potassium 3.7, chloride 104, bicarbonate 22, BUN 12, creatinine 0.66, glucose 80, calcium 9.4, lipase 42, total protein 7.5, albumin 4.0, total bilirubin 0.9, alkaline  phosphatase 62, AST 36, and ALT is 36.   RADIOLOGIC DATA:  No current radiographic studies since admission.   ASSESSMENT AND PLAN: 1.  Spinal and epidural anesthesia-induced headache during puerperium. Anesthesia has been  consulted for this, and the patient is currently getting analgesia for her headache and Anesthesia will see her. We will keep her n.p.o., so that they can perform the blood patch procedure for her in the morning per their evaluation.  2.  Deep vein thrombosis prophylaxis in this patient is with sequential compression devices only given preparation for this procedure.   CODE STATUS:  This patient is a full code.   TIME SPENT ON THIS ADMISSION:  45 minutes.    ____________________________ Candace Cruise. Anne Hahn, MD dfw:nb D: 10/12/2014 05:05:31 ET T: 10/12/2014 06:31:11 ET JOB#: [id number]  cc: Candace Cruise. Anne Hahn, MD, <Dictator> Maleek Craver Scotty Court MD ELECTRONICALLY SIGNED 10/13/2014 3:05

## 2014-11-13 ENCOUNTER — Emergency Department
Admission: EM | Admit: 2014-11-13 | Discharge: 2014-11-13 | Disposition: A | Discharge status: Home / Self Care | Payer: Medicaid Other | Attending: Emergency Medicine | Admitting: Emergency Medicine

## 2014-11-13 ENCOUNTER — Encounter: Payer: Self-pay | Admitting: Emergency Medicine

## 2014-11-13 DIAGNOSIS — Z3202 Encounter for pregnancy test, result negative: Secondary | ICD-10-CM | POA: Diagnosis not present

## 2014-11-13 DIAGNOSIS — Z79899 Other long term (current) drug therapy: Secondary | ICD-10-CM | POA: Insufficient documentation

## 2014-11-13 DIAGNOSIS — F419 Anxiety disorder, unspecified: Secondary | ICD-10-CM | POA: Insufficient documentation

## 2014-11-13 DIAGNOSIS — F1110 Opioid abuse, uncomplicated: Secondary | ICD-10-CM | POA: Diagnosis not present

## 2014-11-13 DIAGNOSIS — F1910 Other psychoactive substance abuse, uncomplicated: Secondary | ICD-10-CM

## 2014-11-13 DIAGNOSIS — G4700 Insomnia, unspecified: Secondary | ICD-10-CM | POA: Diagnosis not present

## 2014-11-13 DIAGNOSIS — Z008 Encounter for other general examination: Secondary | ICD-10-CM | POA: Diagnosis present

## 2014-11-13 DIAGNOSIS — F32A Depression, unspecified: Secondary | ICD-10-CM | POA: Insufficient documentation

## 2014-11-13 DIAGNOSIS — R51 Headache: Secondary | ICD-10-CM | POA: Insufficient documentation

## 2014-11-13 DIAGNOSIS — F329 Major depressive disorder, single episode, unspecified: Secondary | ICD-10-CM | POA: Insufficient documentation

## 2014-11-13 DIAGNOSIS — F1310 Sedative, hypnotic or anxiolytic abuse, uncomplicated: Secondary | ICD-10-CM | POA: Insufficient documentation

## 2014-11-13 HISTORY — DX: Anxiety disorder, unspecified: F41.9

## 2014-11-13 HISTORY — DX: Depression, unspecified: F32.A

## 2014-11-13 HISTORY — DX: Major depressive disorder, single episode, unspecified: F32.9

## 2014-11-13 HISTORY — DX: Bipolar disorder, unspecified: F31.9

## 2014-11-13 LAB — URINE DRUG SCREEN, QUALITATIVE (ARMC ONLY)
AMPHETAMINES, UR SCREEN: NOT DETECTED
Barbiturates, Ur Screen: POSITIVE — AB
Benzodiazepine, Ur Scrn: NOT DETECTED
CANNABINOID 50 NG, UR ~~LOC~~: NOT DETECTED
Cocaine Metabolite,Ur ~~LOC~~: NOT DETECTED
MDMA (Ecstasy)Ur Screen: NOT DETECTED
METHADONE SCREEN, URINE: NOT DETECTED
Opiate, Ur Screen: POSITIVE — AB
Phencyclidine (PCP) Ur S: NOT DETECTED
TRICYCLIC, UR SCREEN: NOT DETECTED

## 2014-11-13 LAB — CBC
HEMATOCRIT: 40.6 % (ref 35.0–47.0)
Hemoglobin: 12.8 g/dL (ref 12.0–16.0)
MCH: 29.2 pg (ref 26.0–34.0)
MCHC: 31.6 g/dL — ABNORMAL LOW (ref 32.0–36.0)
MCV: 92.2 fL (ref 80.0–100.0)
Platelets: 460 10*3/uL — ABNORMAL HIGH (ref 150–440)
RBC: 4.4 MIL/uL (ref 3.80–5.20)
RDW: 14.6 % — AB (ref 11.5–14.5)
WBC: 11.9 10*3/uL — ABNORMAL HIGH (ref 3.6–11.0)

## 2014-11-13 LAB — URINALYSIS COMPLETE WITH MICROSCOPIC (ARMC ONLY)
Bacteria, UA: NONE SEEN
Bilirubin Urine: NEGATIVE
GLUCOSE, UA: NEGATIVE mg/dL
HGB URINE DIPSTICK: NEGATIVE
Ketones, ur: NEGATIVE mg/dL
LEUKOCYTES UA: NEGATIVE
NITRITE: NEGATIVE
Protein, ur: NEGATIVE mg/dL
Specific Gravity, Urine: 1.027 (ref 1.005–1.030)
pH: 5 (ref 5.0–8.0)

## 2014-11-13 LAB — COMPREHENSIVE METABOLIC PANEL
ALBUMIN: 4.4 g/dL (ref 3.5–5.0)
ALT: 77 U/L — ABNORMAL HIGH (ref 14–54)
AST: 55 U/L — ABNORMAL HIGH (ref 15–41)
Alkaline Phosphatase: 71 U/L (ref 38–126)
Anion gap: 7 (ref 5–15)
BUN: 9 mg/dL (ref 6–20)
CO2: 26 mmol/L (ref 22–32)
CREATININE: 0.79 mg/dL (ref 0.44–1.00)
Calcium: 9.4 mg/dL (ref 8.9–10.3)
Chloride: 106 mmol/L (ref 101–111)
GFR calc non Af Amer: >60 mL/min (ref >60)
GLUCOSE: 97 mg/dL (ref 65–99)
Potassium: 3.7 mmol/L (ref 3.5–5.1)
Sodium: 139 mmol/L (ref 135–145)
TOTAL PROTEIN: 7.5 g/dL (ref 6.5–8.1)
Total Bilirubin: 0.4 mg/dL (ref 0.3–1.2)

## 2014-11-13 LAB — ETHANOL

## 2014-11-13 LAB — POCT PREGNANCY, URINE: PREG TEST UR: NEGATIVE

## 2014-11-13 LAB — PREGNANCY, URINE: Preg Test, Ur: NEGATIVE

## 2014-11-13 NOTE — ED Notes (Signed)
BEHAVIORAL HEALTH ROUNDING Patient sleeping: Yes.   Patient alert and oriented: yes Behavior appropriate: Yes.  ; If no, describe: alert Nutrition and fluids offered: Yes  Toileting and hygiene offered: Yes  Sitter present: no Law enforcement present: Yes  

## 2014-11-13 NOTE — ED Notes (Signed)
Report given to Amy T RN 
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
## 2014-11-13 NOTE — ED Notes (Signed)
Pt here under involuntary commitment  

## 2014-11-13 NOTE — ED Notes (Signed)
BEHAVIORAL HEALTH ROUNDING Patient sleeping: yes Patient alert and oriented: yes Behavior appropriate: yes Nutrition and fluids offered: yes Toileting and hygiene offered: yes Sitter present: no Law enforcement present: yes 

## 2014-11-13 NOTE — ED Notes (Signed)
ENVIRONMENTAL ASSESSMENT Potentially harmful objects out of patient reach: Yes.   Personal belongings secured: Yes.   Patient dressed in hospital provided attire only: Yes.   Plastic bags out of patient reach: Yes.   Patient care equipment (cords, cables, call bells, lines, and drains) shortened, removed, or accounted for: Yes.   Equipment and supplies removed from bottom of stretcher: Yes.   Potentially toxic materials out of patient reach: Yes.   Sharps container removed or out of patient reach: No, due to pt in medical treatment room 10.

## 2014-11-13 NOTE — Discharge Instructions (Signed)
You have been seen in the Emergency Department (ED)  today for psychiatric issues.  You have been evaluated by the behavioral medicine specialists and are being referred to: ° °[facility] °[address] °[place], [place] [address] °Phone:  [phone] or [phone] ° °Open Access:   °Walk-in ASSESSMENT hours, M-W-F, 8:00am - 3:00pm °Advanced Acess CRISIS:  M-F, 8:00am - 8:00pm °Outpatient Services Office Hours:  M-F, 8:00am - 5:00pm ° °Please return to the Emergency Department (ED)  immediately if you have ANY thoughts of hurting yourself or anyone else, so that we may help you. ° °Please avoid alcohol and drug use. ° °Follow up with your doctor and/or therapist as soon as possible regarding today's ED  visit.   Please follow up any other recommendations and clinic appointments provided by the psychiatry team that saw you in the Emergency Department. ° °

## 2014-11-13 NOTE — ED Notes (Signed)
Pt has had a visit from her husband  Bryan Lemmaeftali Marin  [phone]  [phone]  Husband talked to the pt and she just laid in the bed and did not say much   Continue to monitor
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
## 2014-11-13 NOTE — ED Notes (Signed)
BEHAVIORAL HEALTH ROUNDING Patient sleeping: No. Patient alert and oriented: yes Behavior appropriate: Yes.  ; If no, describe:  Nutrition and fluids offered: Yes  Toileting and hygiene offered: Yes  Sitter present: yes  q15minute checks Law enforcement present: yes  ODS 

## 2014-11-13 NOTE — ED Notes (Signed)
Pt transferred into ED BHU  room 2 [facility]  Pt educated about dayroom area - BR location - phone times visitation times  Pt made aware that cameras monitor all activity in this area  Pt awaiting psych consult    Pt is alert and oriented denies pain

## 2014-11-13 NOTE — ED Notes (Signed)
BEHAVIORAL HEALTH ROUNDING Patient sleeping: No. Patient alert and oriented: yes Behavior appropriate: Yes.  ; If no, describe:  Nutrition and fluids offered: Yes  Toileting and hygiene offered: Yes  Sitter present: yes  q 15 minute checks continue Law enforcement present:yes  ODS

## 2014-11-13 NOTE — ED Notes (Signed)
Pt sleeping   Even nonlabored respirations observed   Continue to monitor

## 2014-11-13 NOTE — ED Notes (Signed)
ED BHU PLACEMENT JUSTIFICATION Is the patient under IVC or is there intent for IVC: Yes.   Is the patient medically cleared: Yes.   Is there vacancy in the ED BHU: Yes.   Is the population mix appropriate for patient: Yes.   Is the patient awaiting placement in inpatient or outpatient setting: Yes.   Has the patient had a psychiatric consult: Yes.   Survey of unit performed for contraband, proper placement and condition of furniture, tampering with fixtures in bathroom, shower, and each patient room: Yes.  ; Findings:  APPEARANCE/BEHAVIOR Calm and cooperative NEURO ASSESSMENT Orientation:  Oriented to person place and time Hallucinations: No.None noted (Hallucinations) Speech: Normal Gait: normal RESPIRATORY ASSESSMENT Eve, nonlabored respirations observed   No respiratory distress CARDIOVASCULAR ASSESSMENT regular rate and rhythm, S1, S2 normal, no murmur, click, rub or gallop and regular rate  skin warm and dry  pulses equal GASTROINTESTINAL ASSESSMENT wnl EXTREMITIES FROM  Steady gait observed PLAN OF CARE Provide calm/safe environment. Vital signs assessed twice daily. ED BHU Assessment once each 12-hour shift. Collaborate with intake RN daily or as condition indicates. Assure the ED provider has rounded once each shift. Provide and encourage hygiene. Provide redirection as needed. Assess for escalating behavior; address immediately and inform ED provider.  Assess family dynamic and appropriateness for visitation as needed: Yes.  ; If necessary, describe findings:  Educate the patient/family about BHU procedures/visitation: Yes.  ; If necessary, describe findings:

## 2014-11-13 NOTE — ED Notes (Signed)
Discharge instructions reviewed with the pt and she verbalized agreement and understanding

## 2014-11-13 NOTE — ED Notes (Signed)
Supper provided  Pt awake lying in bed  NAD observed

## 2014-11-13 NOTE — ED Notes (Signed)
BEHAVIORAL HEALTH ROUNDING Patient sleeping: No. Patient alert and oriented: yes Behavior appropriate: Yes.  ; If no, describe:  Nutrition and fluids offered: Yes  Toileting and hygiene offered: Yes  Sitter present: no Law enforcement present: Yes  

## 2014-11-13 NOTE — ED Notes (Signed)
Pt to be discharged to home   1/1 bags of belongings returned to the pt and she verbalized that she received all of her belongings back that she came here with

## 2014-11-13 NOTE — ED Notes (Addendum)

## 2014-11-13 NOTE — BH Assessment (Signed)
Assessment Note  Sandra Gould is an 29 y.o. female who reports to the ED by way of [place] police. She was brought in under IVC due to altercation between herself and her husband.  She reports that she was "in a little fight"  With her husband and she admitted to being diagnosed Bipolar, depressed, and having anxiety. She stated that she needs to be medicated for her mental health concerns.  She expressed that she has been feeling increasingly aggressive.  She reports becoming upset and throwing and breaking items.  She reports that she has a simple assault charge stemming from a previous argument with her husband. She states that her sister took out the IVC papers on her and she did not understand why.  She denied having auditory or visual hallucinations.  She denied having homicidal or suicidal ideation or intent.    Axis I: Bipolar, mixed and Rule out Substance dependence Axis II: No diagnosis Axis III:  Past Medical History  Diagnosis Date   UTI (lower urinary tract infection)    Bipolar 1 disorder    Depression    Anxiety    Axis IV: occupational problems, other psychosocial or environmental problems, problems related to legal system/crime and problems with primary support group Axis V: 41-50 serious symptoms  Past Medical History:  Past Medical History  Diagnosis Date   UTI (lower urinary tract infection)    Bipolar 1 disorder    Depression    Anxiety     Past Surgical History  Procedure Laterality Date   Wisdom tooth extraction      Family History: No family history on file.  Social History:  reports that she has quit smoking. She does not have any smokeless tobacco history on file. She reports that she does not drink alcohol or use illicit drugs.  Additional Social History:     CIWA: CIWA-Ar BP: 115/72 mmHg Pulse Rate: 96 COWS:    Allergies: No Known Allergies  Home Medications:  (Not in a hospital admission)  OB/GYN Status:  Patient's last  menstrual period was 12/31/2013 (approximate).  General Assessment Data Location of Assessment: [facility] ED TTS Assessment: In system Is this a Tele or Face-to-Face Assessment?: Face-to-Face Is this an Initial Assessment or a Re-assessment for this encounter?: Initial Assessment Marital status: Married Is patient pregnant?: No Living Arrangements: Spouse/significant other Can pt return to current living arrangement?: Yes Admission Status: Involuntary Referral Source: MD Insurance type: Medicaid     Crisis Care Plan Living Arrangements: Spouse/significant other  Education Status Is patient currently in school?: No  Risk to self with the past 6 months Suicidal Intent: No Has patient had any suicidal intent within the past 6 months prior to admission? : No Is patient at risk for suicide?: No Suicidal Plan?: No Has patient had any suicidal plan within the past 6 months prior to admission? : No What has been your use of drugs/alcohol within the last 12 months?: Denies usage, refused to complete UDS. Family Suicide History: Unknown Depression: Yes Substance abuse history and/or treatment for substance abuse?: No  Risk to Others within the past 6 months Homicidal Ideation: No Does patient have any lifetime risk of violence toward others beyond the six months prior to admission? : No Current Homicidal Intent: No Current Homicidal Plan: No Access to Homicidal Means: No Does patient have access to weapons?: No Does patient have a court date: Yes Court Date: 01/16/15 Is patient on probation?: No  Psychosis Hallucinations: None noted Delusions: None  noted  Mental Status Report Appearance/Hygiene: Disheveled Eye Contact: Poor Motor Activity: Unremarkable Speech: Argumentative Level of Consciousness: Alert Mood: Irritable Affect: Flat Anxiety Level: None Thought Processes: Flight of Ideas Judgement: Unimpaired Orientation: Person, Place, Situation                             Advance Directives (For Healthcare) Does patient have an advance directive?: No Would patient like information on creating an advanced directive?: Yes - Educational materials given          Disposition:  Disposition Initial Assessment Completed for this Encounter: Yes Disposition of Patient: Referred to (To be seen by the psychiatrist)  On Site Evaluation by:   Reviewed with Physician:    Sandra Gould 11/13/2014 8:36 AM

## 2014-11-13 NOTE — ED Notes (Signed)
Lunch provided  Pt lying in bed  NAD assessed  Pt with no verbalized needs or complaints at this time  Continue to monitor

## 2014-11-13 NOTE — ED Notes (Signed)
BEHAVIORAL HEALTH ROUNDING Patient sleeping: No. Patient alert and oriented: yes Behavior appropriate: Yes.  ; If no, describe:  Nutrition and fluids offered: Yes  Toileting and hygiene offered: Yes  Sitter present: yes  q15 minute checks are being performed Law enforcement present: yes  ODS

## 2014-11-13 NOTE — ED Notes (Signed)
BEHAVIORAL HEALTH ROUNDING Patient sleeping: Yes.   Patient alert and oriented: sleeping Behavior appropriate: Yes.  ; If no, describe:  Nutrition and fluids offered: Yes  Toileting and hygiene offered: Yes  Sitter present: yes  q15 minute checks Law enforcement present: yes  ODS 

## 2014-11-13 NOTE — ED Provider Notes (Signed)
-----------------------------------------   6:15 PM on 11/13/2014 -----------------------------------------  I spoke in person with Dr. Toni Amendlapacs after his evaluation of the patient. He feels that she is safe for discharge [facility].  We will provide her with the appropriate follow-up information. I reviewed her lab results and she has no acute medical problems that need to be addressed upon discharge.  Sandra Roseory Mansel Strother, MD 11/13/14 [phone]

## 2014-11-13 NOTE — ED Notes (Signed)
ENVIRONMENTAL ASSESSMENT Potentially harmful objects out of patient reach: yes Personal belongings secured: yes Patient dressed in hospital provided attire only: yes Plastic bags out of patient reach: yes Patient care equipment (cords, cables, call bells, lines, and drains) shortened, removed, or accounted for: out of room Equipment and supplies removed from bottom of stretcher: yes Potentially toxic materials out of patient reach: yes Sharps container removed or out of patient reach: yes

## 2014-11-13 NOTE — ED Notes (Signed)
Pt is calm and cooperative. Pt is resting comfortably. NAD noted.

## 2014-11-13 NOTE — ED Notes (Signed)
Pt sleeping  Even nonlabored respirations observed

## 2014-11-20 NOTE — H&P (Signed)
L&D Evaluation:  History:  HPI 29 yo [id number] with LMP of     & EDD of    [facility] "stabbing pain in the RUQ starting 2 weeks ago and worsening now to 10 on scale of 1-10". Pt has had no difficulty eating, normal BM's, normal urination, no fever, no aching, no chills, no HA, no diarrhea,   Presents with contractions   Patient's Medical History No Chronic Illness   Patient's Surgical History none   Medications Pre Natal Vitamins   Allergies NKDA   Social History none   Family History Non-Contributory   ROS:  ROS All systems were reviewed.  HEENT, CNS, GI, GU, Respiratory, CV, Renal and Musculoskeletal systems were found to be normal.   Exam:  Vital Signs stable   Urine Protein not completed   General no apparent distress   Mental Status clear   Chest clear   Heart normal sinus rhythm, no murmur/gallop/rubs   Estimated Fetal Weight Average for gestational age   Back no CVAT   Edema 1+   Reflexes 1+   Mebranes Intact   FHT normal rate with no decels   Skin dry   Electronic Signatures: Sharee PimpleJones, Caron W (CNM)  (Signed 27-Nov-15 22:14)  Authored: L&D Evaluation   Last Updated: 27-Nov-15 22:14 by Sharee PimpleJones, Caron W (CNM)

## 2014-11-20 NOTE — H&P (Signed)
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 Pt has been taking Percocet administered by [facility] for pain. Pt here tonight as she is having intense pain in the RUQ and has not taken Percocet since yest am. Pt is jittery and anxious and feels like she "needs drugs to relieve her Detox S/S".   Presents with RUQ pain related to Liver disease probably related to Hep C vs. Drug Addiction hx   Patient's Medical History anxiety, Depression, Hepatitis C, Dental pain, Elevated transaminase and LDH, dizziness, Substance Abuse, Gest HTN   Patient's Surgical History none   Medications Pre [place] Vitamins   Social History drugs  Opiates[facility], on probation for [employer] and larcency charges 07/2011, former smoker   Family History Non-Contributory   ROS:  ROS All systems were reviewed.  HEENT, CNS, GI, GU, Respiratory, CV, Renal and Musculoskeletal systems were found to be normal., Except RUQ pain and detox S/S   Exam:  Vital Signs stable  117/77 Afebrile   Urine Protein trace   General no apparent distress   Mental Status clear  Anxious and asking for pain meds   Chest clear   Heart no murmur/gallop/rubs   Abdomen Gravid, TTP over RUQ   Back no CVAT   Edema 1+   Pelvic not eval   Mebranes Intact   FHT normal rate with no decels, Maternal tracing at times, reactive NST   Ucx irregular   Skin dry   Lymph no lymphadenopathy   Impression:  Impression IUP at 31 weeks with Rt upper quad pain with Drug Addiction   Plan:  Plan See below   Comments Pt has had RUQ pain and is waiting to see a liver specialist. She has been on Percocet given by her [facility] at  [facility]. Pt had last dose yest and now is jittery, anxious, feels "horrible" as she has no more meds and is giong through withdrawl. Road conditions are in a "state of Emergency" due to snow so [facility] is not feasible until the roads clear. Dr. Soon in the ER called and report given and she agreed to take the pt since we cannot get the pt to Detox center tonight. She agreed that a Mental heatlh eval would be done as well as follow up on the elevated labs of the Liver. Notes read and report to Dr Soon since that is all the info we have on this pt. Pt was taken via wc to the ER for evaluation and for treatment of Drug Addiction. No labor S/S, no pre-ex as BP is normal, trace protein, no blurred or double vision. +RUQ pain which has been present throughout the pregnancy.   Electronic Signatures: Sharee PimpleJones, Audryna Wendt W (CNM)  (Signed 23-Jan-16 01:36)  Authored: L&D Evaluation   Last Updated: 23-Jan-16 01:36 by Sharee PimpleJones, Raeli Wiens W (CNM)

## 2017-01-01 IMAGING — MR MR [PERSON_NAME] HEAD OR NECK WO/W CM
1 series · 48 of 48 positions shown · non-contrast
Comparison: None.

CLINICAL DATA: Headache. Two weeks post partum. History migraine
headache.

EXAM:
MR VENOGRAM the HEAD WITHOUT CONTRAST
TECHNIQUE: Angiographic images of the intracranial venous structures were
obtained using MRV technique without intravenous contrast.

[Series 2: (id) · coronal · 3.0mm · 0.98mm/px · 48 of 96 slices shown]
[im 1/96]
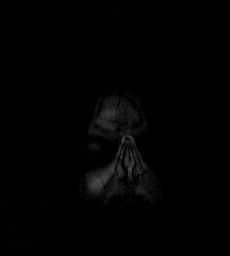
[im 3/96]
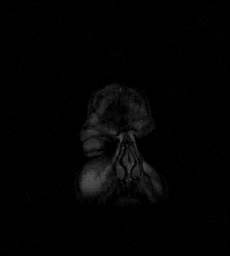
[im 5/96]
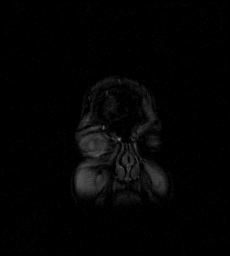
[im 7/96]
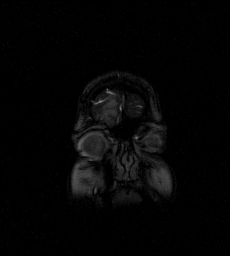
[im 9/96]
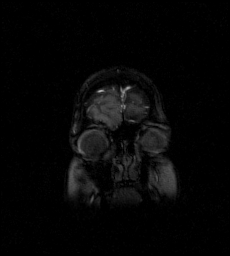
[im 11/96]
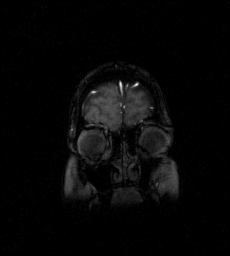
[im 13/96]
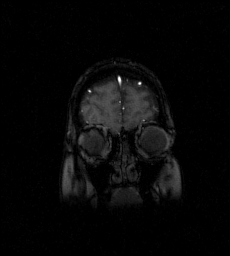
[im 15/96]
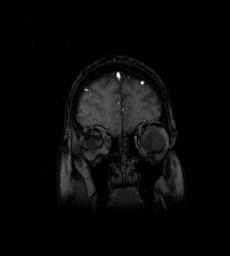
[im 17/96]
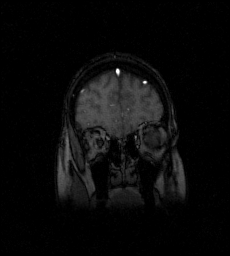
[im 19/96]
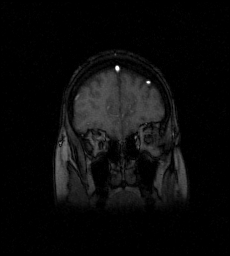
[im 21/96]
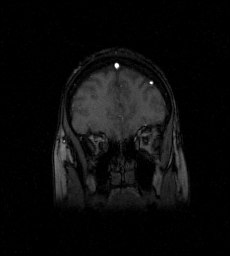
[im 23/96]
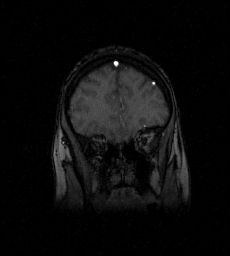
[im 25/96]
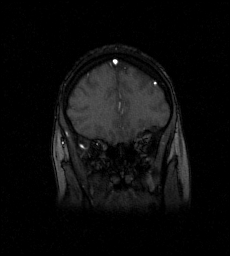
[im 27/96]
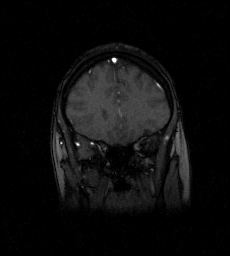
[im 29/96]
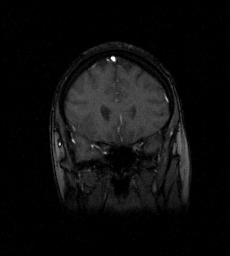
[im 31/96]
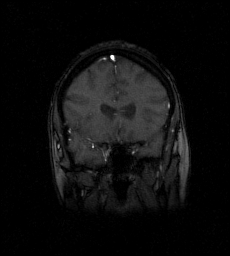
[im 33/96]
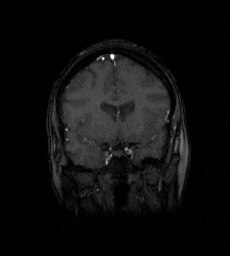
[im 35/96]
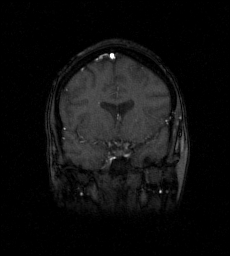
[im 37/96]
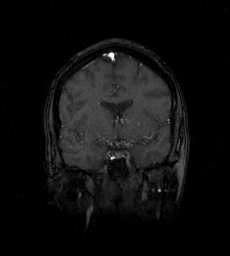
[im 39/96]
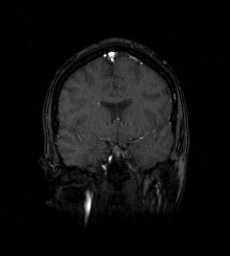
[im 41/96]
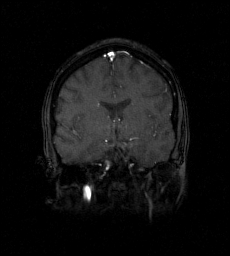
[im 43/96]
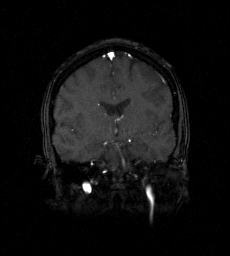
[im 45/96]
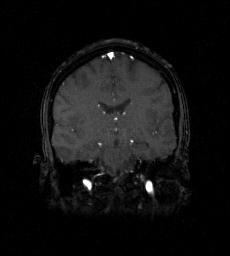
[im 47/96]
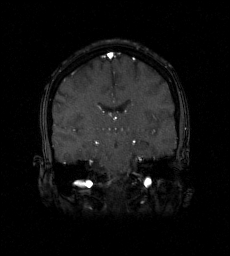
[im 49/96]
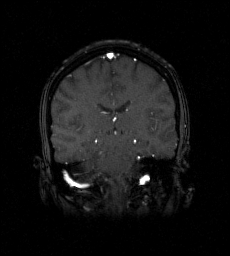
[im 51/96]
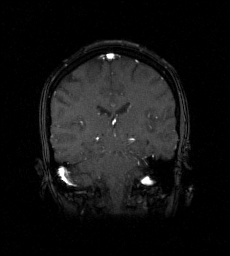
[im 53/96]
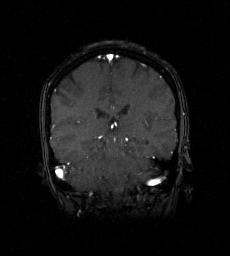
[im 55/96]
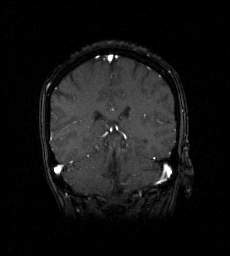
[im 57/96]
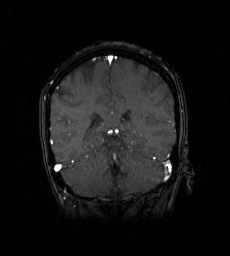
[im 59/96]
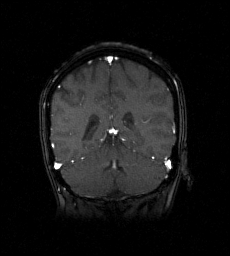
[im 61/96]
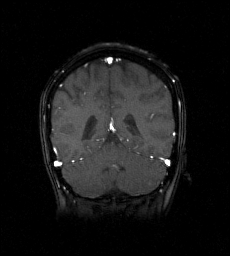
[im 63/96]
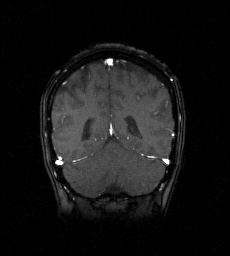
[im 65/96]
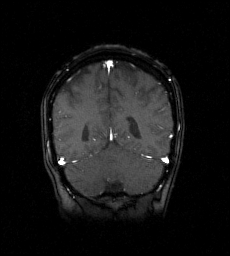
[im 67/96]
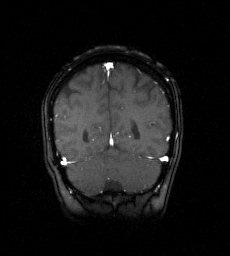
[im 69/96]
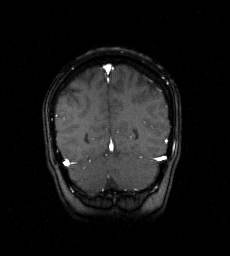
[im 71/96]
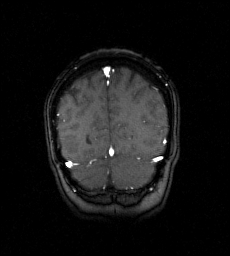
[im 73/96]
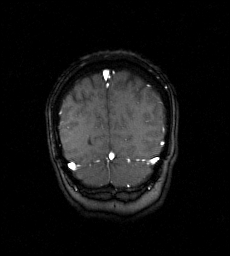
[im 75/96]
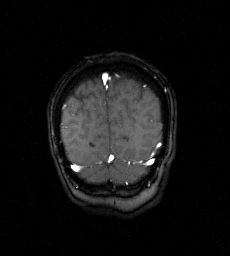
[im 77/96]
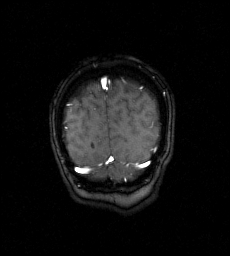
[im 79/96]
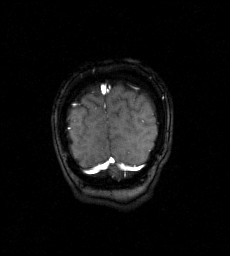
[im 81/96]
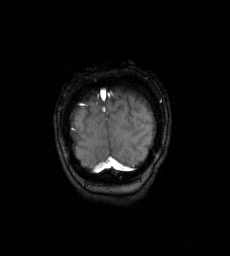
[im 83/96]
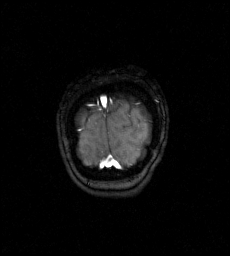
[im 85/96]
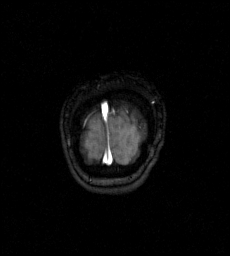
[im 87/96]
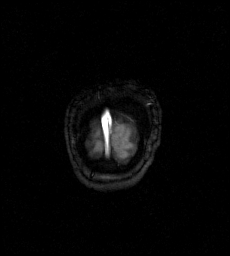
[im 89/96]
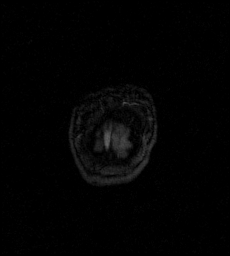
[im 91/96]
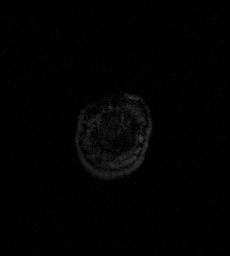
[im 93/96]
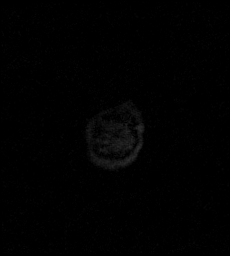
[im 96/96]
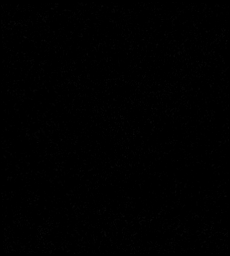

[48 of 48 positions shown; findings below may reference images not displayed]

FINDINGS: Major dural sinuses appear normal. No occlusion or filling defect.
The superior sagittal sinus is normal. Internal cerebral veins and
straight sinus are normal. Transverse sinus and sigmoid sinus are
normal bilaterally.
IMPRESSION: Negative

## 2019-02-18 ENCOUNTER — Encounter: Payer: Self-pay | Admitting: Emergency Medicine

## 2019-02-18 ENCOUNTER — Other Ambulatory Visit: Payer: Self-pay

## 2019-02-18 ENCOUNTER — Emergency Department
Admission: EM | Admit: 2019-02-18 | Discharge: 2019-02-18 | Disposition: A | Discharge status: Home / Self Care | Payer: Self-pay | Attending: Emergency Medicine | Admitting: Emergency Medicine

## 2019-02-18 DIAGNOSIS — Z79899 Other long term (current) drug therapy: Secondary | ICD-10-CM | POA: Insufficient documentation

## 2019-02-18 DIAGNOSIS — Z760 Encounter for issue of repeat prescription: Secondary | ICD-10-CM | POA: Insufficient documentation

## 2019-02-18 DIAGNOSIS — F17200 Nicotine dependence, unspecified, uncomplicated: Secondary | ICD-10-CM | POA: Insufficient documentation

## 2019-02-18 HISTORY — DX: Unspecified convulsions: R56.9

## 2019-02-18 HISTORY — DX: Rheumatoid arthritis, unspecified: M06.9

## 2019-02-18 MED ORDER — PREDNISONE 5 MG PO TABS: ORAL_TABLET | ORAL | 0 refills | Status: AC

## 2019-02-18 MED ORDER — GABAPENTIN 600 MG PO TABS: 600.0000 mg | ORAL_TABLET | Freq: Four times a day (QID) | ORAL | 0 refills | Status: AC | PRN

## 2019-02-18 MED ORDER — SUMATRIPTAN SUCCINATE 100 MG PO TABS
100.0000 mg | ORAL_TABLET | Freq: Once | ORAL | 0 refills | Status: AC | PRN
Start: 2019-02-18 — End: 2020-02-19

## 2019-02-18 NOTE — ED Triage Notes (Signed)
Patient states that she has been out of her medication for a few days. Patient states that she is out of her gabapentin 800 mg four times a day. Patient states that she was also taking 20 mg of prednisone and was suppose to taper off of those. Patient states that she is out of her plaqunil and imitrex but is unsure of the dose.

## 2019-02-18 NOTE — ED Provider Notes (Signed)
[facility] Emergency Department Provider Note  ____________________________________________   First MD Initiated Contact with Patient 02/18/19 0250     (approximate)  I have reviewed the triage vital signs and the nursing notes.   HISTORY  Chief Complaint Medication Refill    HPI Sandra Gould is a 33 y.o. female with medical history as listed below who presents for medication refills.  She reports that she has been on "a pharmacy of medications" by a primary care doctor for years including years of prednisone 20 mg daily, gabapentin 800 mg 4 times a day, etc.  She was recently hospitalized (about a week ago) at [facility] for altered mental status and seizure-like activity.  This was ultimately thought to be secondary to an unintentional overdose of Vyvanse and/or gabapentin.  She also takes Suboxone chronically and reports that she was recently started on Plaquenil by her rheumatologist.  She is very frustrated night because she states that her medications were left at [facility] when she was discharged and she has not been able to get them back in spite of calling them and having them looking for them.  She has a follow-up appointment with a new primary care provider next week but she states that she needs some medicines to get her through.  She is not asking for Suboxone nor for Plaquenil which she says she still has.  She is requesting prescriptions for gabapentin, Imitrex, and prednisone, the prednisone mostly because she has been on it for so long she is afraid to not have a taper, but she states that she very much would like to be off of the prednisone.  She complains of general malaise which she has experienced "forever" and says that she has been having swelling in her legs and sometimes her hands which is very frustrating as well.  She denies any contact with COVID-19 patients and denies fever, sore throat, chest pain, and shortness of breath.           Past Medical History:  Diagnosis Date   Anxiety    Bipolar 1 disorder (HCC)    Depression    Rheumatoid arthritis (HCC)    Seizures (HCC)    UTI (lower urinary tract infection)     Patient Active Problem List   Diagnosis Date Noted   Depression    Substance abuse (HCC)     Past Surgical History:  Procedure Laterality Date   WISDOM TOOTH EXTRACTION      Prior to Admission medications   Medication Sig Start Date End Date Taking? Authorizing Provider  gabapentin (NEURONTIN) 600 MG tablet Take 1 tablet (600 mg total) by mouth every 6 (six) hours as needed (pain). 02/18/19 02/18/20  Loleta RoseForbach, Kevina Piloto, MD  methocarbamol (ROBAXIN-750) 750 MG tablet Take 1 tablet (750 mg total) by mouth 4 (four) times daily. 01/04/14   Marisa Severintter, Olga, MD  oxyCODONE-acetaminophen (PERCOCET/ROXICET) 5-325 MG per tablet Take 1-2 tablets by mouth every 6 (six) hours as needed for severe pain. 01/04/14   Marisa Severintter, Olga, MD  predniSONE (DELTASONE) 5 MG tablet Take 3 tablets (15 mg total) by mouth daily with breakfast for 5 days, THEN 2 tablets (10 mg total) daily with breakfast for 5 days, THEN 1 tablet (5 mg total) daily with breakfast for 5 days, THEN 0.5 tablets (2.5 mg total) daily with breakfast for 6 days. 02/18/19 03/11/19  Loleta RoseForbach, Dabria Wadas, MD  Prenatal Vit-Fe Fumarate-FA (PRENATAL MULTIVITAMIN) TABS tablet Take 1 tablet by mouth daily at 12 noon.  [provider]  SUMAtriptan (IMITREX) 100 MG tablet Take 1 tablet (100 mg total) by mouth once as needed for migraine. May repeat in 2 hours if headache persists or recurs. 02/18/19 02/19/20  Loleta RoseForbach, Imoni Kohen, MD    Allergies Other  History reviewed. No pertinent family history.  Social History Social History   Tobacco Use   Smoking status: Current Every Day Smoker   Smokeless tobacco: Never Used  Substance Use Topics   Alcohol use: No   Drug use: No    Review of Systems Constitutional: General malaise chronically.  No  fever/chills Cardiovascular: Denies chest pain. Respiratory: Denies shortness of breath.  Denies cough. Gastrointestinal: No abdominal pain.  No nausea, no vomiting.  No diarrhea.  No constipation. Genitourinary: Negative for dysuria. Musculoskeletal: Swelling in bilateral lower extremities and sometimes in bilateral hands.  Negative for neck pain.  Negative for back pain. Integumentary: Negative for rash. Neurological: Negative for headaches, focal weakness or numbness.   ____________________________________________   PHYSICAL EXAM:  VITAL SIGNS: ED Triage Vitals  Enc Vitals Group     BP 02/18/19 0107 (!) 160/110     Pulse Rate 02/18/19 0107 (!) 104     Resp 02/18/19 0107 18     Temp 02/18/19 0107 98.9 F (37.2 C)     Temp Source 02/18/19 0107 Oral     SpO2 02/18/19 0107 100 %     Weight 02/18/19 0110 78 kg (172 lb)     Height 02/18/19 0110 1.575 m (5\' 2" )     Head Circumference --      Peak Flow --      Pain Score 02/18/19 0110 8     Pain Loc --      Pain Edu? --      Excl. in GC? --     Constitutional: Alert and oriented.  No acute distress, ambulatory without difficulty. Eyes: Conjunctivae are normal.  Head: Atraumatic. Cardiovascular: Normal rate, regular rhythm. Good peripheral circulation. Respiratory: Normal respiratory effort.  No retractions. Musculoskeletal: Trace pitting edema in bilateral lower extremities.  No gross deformities of extremities. Neurologic:  Normal speech and language. No gross focal neurologic deficits are appreciated.  Skin:  Skin is warm, dry and intact. Psychiatric: Mood and affect are a little bit anxious and pressured but generally normal for the circumstances, no signs of emergent psychiatric illness or condition.  ____________________________________________   LABS (all labs ordered are listed, but only abnormal results are displayed)  Labs Reviewed - No data to display ____________________________________________  EKG  No  indication for EKG ____________________________________________  RADIOLOGY I, Loleta Roseory Aneri Slagel, personally viewed and evaluated these images (plain radiographs) as part of my medical decision making, as well as reviewing the written report by the radiologist.  ED MD interpretation:  no indication for imaging  Official radiology report(s): No results found.  ____________________________________________   PROCEDURES   Procedure(s) performed (including Critical Care):  Procedures   ____________________________________________   INITIAL IMPRESSION / MDM / ASSESSMENT AND PLAN / ED COURSE  As part of my medical decision making, I reviewed the following data within the electronic MEDICAL RECORD NUMBER Nursing notes reviewed and incorporated, Old chart reviewed and [place] Controlled Substance Database and reviewed records from prior ED visits   I reviewed care everywhere and saw the notes about her hospitalization at [facility] and I reviewed the discharge instructions regarding which medications to stop, continue, or take differently than before.  I also verified a telephone encounter from a few days ago  that apparently has to do with getting established with a primary care provider.  Of note, the medication she mentioned to me tonight that she needs prescriptions for, gabapentin, Imitrex, and prednisone, or the ones mentioned in that telephone note.  They also mention Suboxone but the patient is not asking for it, stating that she has a week supply, and I explained I do not write for Suboxone.  At this point because the records corroborate the story she is telling me, I see no reason not to help her by providing her prescriptions for the gabapentin, Imitrex, and a prednisone taper.  She feels very strongly about getting off of the prednisone so I provided a 21-day taper to taper down from 15 mg to essentially 0 mg.  I started at 15 mg because she has not had any for 3 days and she does not want to start back  at 20 mg.  I explained that the ED is not the best place for medication refills but we will help her this 1 time and then she is to follow-up with her PCP.  She states that she understands and agrees with the plan.  No evidence of an emergent or acute medical condition at this time.          ____________________________________________  FINAL CLINICAL IMPRESSION(S) / ED DIAGNOSES  Final diagnoses:  Encounter for medication refill     MEDICATIONS GIVEN DURING THIS VISIT:  Medications - No data to display   ED Discharge Orders         Ordered    gabapentin (NEURONTIN) 600 MG tablet  Every 6 hours PRN     02/18/19 0336    SUMAtriptan (IMITREX) 100 MG tablet  Once PRN     02/18/19 0336    predniSONE (DELTASONE) 5 MG tablet     02/18/19 4854          *Please note:  KENNYA SCHWENN was evaluated in Emergency Department on 02/18/2019 for the symptoms described in the history of present illness. She was evaluated in the context of the global COVID-19 pandemic, which necessitated consideration that the patient might be at risk for infection with the SARS-CoV-2 virus that causes COVID-19. Institutional protocols and algorithms that pertain to the evaluation of patients at risk for COVID-19 are in a state of rapid change based on information released by regulatory bodies including the CDC and federal and state organizations. These policies and algorithms were followed during the patient's care in the ED.  Some ED evaluations and interventions may be delayed as a result of limited staffing during the pandemic.*  Note:  This document was prepared using Dragon voice recognition software and may include unintentional dictation errors.   Hinda Kehr, MD 02/18/19 [phone]

## 2020-02-11 DEATH — deceased
